# Patient Record
Sex: Male | Born: 1985 | Race: White | Hispanic: No | Marital: Married | Smoking: Former smoker
Health system: Southern US, Community
[De-identification: ages and names within clinical notes are randomized; demographics above are authoritative.]

## PROBLEM LIST (undated history)

## (undated) DIAGNOSIS — Z8619 Personal history of other infectious and parasitic diseases: Secondary | ICD-10-CM

## (undated) HISTORY — DX: Personal history of other infectious and parasitic diseases: Z86.19

---

## 2015-02-14 ENCOUNTER — Ambulatory Visit: Payer: Self-pay | Admitting: Family Medicine

## 2015-04-23 ENCOUNTER — Ambulatory Visit: Payer: Self-pay | Admitting: Family Medicine

## 2015-05-06 ENCOUNTER — Encounter: Payer: Self-pay | Admitting: Family Medicine

## 2015-05-06 ENCOUNTER — Ambulatory Visit (INDEPENDENT_AMBULATORY_CARE_PROVIDER_SITE_OTHER): Payer: 59 | Admitting: Family Medicine

## 2015-05-06 ENCOUNTER — Telehealth: Payer: Self-pay | Admitting: Family Medicine

## 2015-05-06 VITALS — BP 126/78 | HR 89 | Temp 98.0°F | Ht 68.0 in | Wt 201.0 lb

## 2015-05-06 DIAGNOSIS — Z8349 Family history of other endocrine, nutritional and metabolic diseases: Secondary | ICD-10-CM | POA: Insufficient documentation

## 2015-05-06 DIAGNOSIS — Z8249 Family history of ischemic heart disease and other diseases of the circulatory system: Secondary | ICD-10-CM

## 2015-05-06 DIAGNOSIS — E669 Obesity, unspecified: Secondary | ICD-10-CM

## 2015-05-06 DIAGNOSIS — Z0000 Encounter for general adult medical examination without abnormal findings: Secondary | ICD-10-CM

## 2015-05-06 DIAGNOSIS — E66811 Obesity, class 1: Secondary | ICD-10-CM | POA: Insufficient documentation

## 2015-05-06 NOTE — Assessment & Plan Note (Signed)
Will order screening echo for fmhx cardiomyopathy

## 2015-05-06 NOTE — Addendum Note (Signed)
Addended by: Alvina ChouWALSH, Harmoney Sienkiewicz J on: 05/06/2015 03:33 PM   Modules accepted: Orders

## 2015-05-06 NOTE — Assessment & Plan Note (Signed)
Preventative protocols reviewed and updated unless pt declined. Discussed healthy diet and lifestyle.  

## 2015-05-06 NOTE — Progress Notes (Signed)
Pre visit review using our clinic review tool, if applicable. No additional management support is needed unless otherwise documented below in the visit note. 

## 2015-05-06 NOTE — Assessment & Plan Note (Signed)
Check LFT, iron panel next labs.

## 2015-05-06 NOTE — Addendum Note (Signed)
Addended by: Eustaquio BoydenGUTIERREZ, Dionne Rossa on: 05/06/2015 03:24 PM   Modules accepted: Orders

## 2015-05-06 NOTE — Telephone Encounter (Signed)
Keith Rasmussen pt brother called stated Fayrene FearingJames would like you to transfer harry ([birth year]85) family history to his chart Keith Rasmussen see dr g also

## 2015-05-06 NOTE — Telephone Encounter (Signed)
We've not seen this patient. He has cancelled all of his appts. If and when we see him, we will update his history.

## 2015-05-06 NOTE — Progress Notes (Signed)
BP 126/78 mmHg  Pulse 89  Temp(Src) 98 F (36.7 C) (Oral)  Ht  (1.727 m)  Wt 201 lb (91.173 kg)  BMI 30.57 kg/m2  SpO2 97%   CC: new pt to establish  Subjective:    Patient ID: Keith Rasmussen, male    DOB: 11-21-1985, 29 y.o.   MRN: [id number]  HPI: Keith Rasmussen is a 29 y.o. male presenting on 05/06/2015 for Establish Care   Presents with wife, Baxter Hire. Lost 25 lbs in last 6 months.   Preventative: Flu shot - at work Tdap 2015 Seat belt use discussed Sunscreen use discussed. 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 Interim medical history since our last visit reviewed. Allergies and medications reviewed and updated. No current outpatient prescriptions on file prior to visit.   No current facility-administered medications on file prior to visit.    Review of Systems  Constitutional: Negative for fever, chills, activity change, appetite change, fatigue and unexpected weight change.  HENT: Negative for hearing loss.   Eyes: Negative for visual disturbance.  Respiratory: Negative for cough, chest tightness, shortness of breath and wheezing.   Cardiovascular: Negative for chest pain, palpitations and leg swelling.  Gastrointestinal: Negative for nausea, vomiting, abdominal pain, diarrhea, constipation, blood in stool and abdominal distention.  Genitourinary: Negative for hematuria and difficulty urinating.  Musculoskeletal: Negative for myalgias, arthralgias and neck pain.  Skin: Negative for rash.  Neurological: Negative for dizziness, seizures, syncope and headaches.  Hematological: Negative for adenopathy. Does not bruise/bleed easily.  Psychiatric/Behavioral: Negative for dysphoric  mood. The patient is not nervous/anxious.    Per HPI unless specifically indicated in ROS section     Objective:    BP 126/78 mmHg  Pulse 89  Temp(Src) 98 F (36.7 C) (Oral)  Ht  (1.727 m)  Wt 201 lb (91.173 kg)  BMI 30.57 kg/m2  SpO2 97%  Wt Readings from Last 3 Encounters:  05/06/15 201 lb (91.173 kg)    Physical Exam  Constitutional: He is oriented to person, place, and time. He appears well-developed and well-nourished. No distress.  HENT:  Head: Normocephalic and atraumatic.  Right Ear: Hearing, tympanic membrane, external ear and ear canal normal.  Left Ear: Hearing, tympanic membrane, external ear and ear canal normal.  Nose: Nose normal.  Mouth/Throat: Uvula is midline, oropharynx is clear and moist and mucous membranes are normal. No oropharyngeal exudate, posterior oropharyngeal edema or posterior oropharyngeal erythema.  Eyes: Conjunctivae and EOM are normal. Pupils are equal, round, and reactive to light. No scleral icterus.  Neck: Normal range of motion. Neck supple. No thyromegaly present.  Cardiovascular: Normal rate, regular rhythm, normal heart sounds and intact distal pulses.   No murmur heard. Pulses:      Radial pulses are 2+ on the right side, and 2+ on the left side.  Pulmonary/Chest: Effort normal and breath sounds normal. No respiratory distress. He has no wheezes. He has no rales.  Abdominal: Soft. Bowel sounds are normal. He exhibits no distension and no mass. There is no tenderness. There is no rebound and no guarding.  Musculoskeletal: Normal range of motion. He exhibits no edema.  Lymphadenopathy:    He has no cervical adenopathy.  Neurological: He is  alert and oriented to person, place, and time.  CN grossly intact, station and gait intact  Skin: Skin is warm and dry. No rash noted.  Psychiatric: He has a normal mood and affect. His behavior is normal. Judgment and thought content normal.  Nursing note and vitals reviewed.  No results  found for this or any previous visit.    Assessment & Plan:   Problem List Items Addressed This Visit    Obesity, Class I, BMI 30-34.9    Discussed healthy diet and lifestyle changes to affect sustainable weight loss.      Relevant Orders   Lipid panel   Comprehensive metabolic panel   Health maintenance examination - Primary    Preventative protocols reviewed and updated unless pt declined. Discussed healthy diet and lifestyle.       Family history of hemochromatosis    Check LFT, iron panel next labs.      Relevant Orders   Comprehensive metabolic panel   IBC panel   Family history of first-degree relative with cardiomyopathy    Will order screening echo for fmhx cardiomyopathy      Relevant Orders   Echocardiogram       Follow up plan: Return in about 1 year (around 05/05/2016), or as needed, for annual exam, prior fasting for blood work.

## 2015-05-06 NOTE — Assessment & Plan Note (Signed)
Discussed healthy diet and lifestyle changes to affect sustainable weight loss  

## 2015-05-06 NOTE — Patient Instructions (Addendum)
[facility]. We will check liver, iron and ultrasound of heart. Return as needed or in 1 -2 years for next physical.  Health Maintenance, Male A healthy lifestyle and preventative care can promote health and wellness.  Maintain regular health, dental, and eye exams.  Eat a healthy diet. Foods like vegetables, fruits, whole grains, low-fat dairy products, and lean protein foods contain the nutrients you need and are low in calories. Decrease your intake of foods high in solid fats, added sugars, and salt. Get information about a proper diet from your health care provider, if necessary.  Regular physical exercise is one of the most important things you can do for your health. Most adults should get at least 150 minutes of moderate-intensity exercise (any activity that increases your heart rate and causes you to sweat) each week. In addition, most adults need muscle-strengthening exercises on 2 or more days a week.   Maintain a healthy weight. The body mass index (BMI) is a screening tool to identify possible weight problems. It provides an estimate of body fat based on height and weight. Your health care provider can find your BMI and can help you achieve or maintain a healthy weight. For males 20 years and older:  A BMI below 18.5 is considered underweight.  A BMI of 18.5 to 24.9 is normal.  A BMI of 25 to 29.9 is considered overweight.  A BMI of 30 and above is considered obese.  Maintain normal blood lipids and cholesterol by exercising and minimizing your intake of saturated fat. Eat a balanced diet with plenty of fruits and vegetables. Blood tests for lipids and cholesterol should begin at age 28 and be repeated every 5 years. If your lipid or cholesterol levels are high, you are over age 28, or you are at high risk for heart disease, you may need your cholesterol levels checked more frequently.Ongoing high lipid and cholesterol  levels should be treated with medicines if diet and exercise are not working.  If you smoke, find out from your health care provider how to quit. If you do not use tobacco, do not start.  Lung cancer screening is recommended for adults aged 55-80 years who are at high risk for developing lung cancer because of a history of smoking. A yearly low-dose CT scan of the lungs is recommended for people who have at least a 30-pack-year history of smoking and are current smokers or have quit within the past 15 years. A pack year of smoking is smoking an average of 1 pack of cigarettes a day for 1 year (for example, a 30-pack-year history of smoking could mean smoking 1 pack a day for 30 years or 2 packs a day for 15 years). Yearly screening should continue until the smoker has stopped smoking for at least 15 years. Yearly screening should be stopped for people who develop a health problem that would prevent them from having lung cancer treatment.  If you choose to drink alcohol, do not have more than 2 drinks per day. One drink is considered to be 12 oz (360 mL) of beer, 5 oz (150 mL) of wine, or 1.5 oz (45 mL) of liquor.  Avoid the use of street drugs. Do not share needles with anyone. Ask for help if you need support or instructions about stopping the use of drugs.  High blood pressure causes heart disease and increases the risk of stroke. High blood pressure is more likely  to develop in:  People who have blood pressure in the end of the normal range (100-139/85-89 mm Hg).  People who are overweight or obese.  People who are African American.  If you are 64-19 years of age, have your blood pressure checked every 3-5 years. If you are 12 years of age or older, have your blood pressure checked every year. You should have your blood pressure measured twice--once when you are at a hospital or clinic, and once when you are not at a hospital or clinic. Record the average of the two measurements. To check your  blood pressure when you are not at a hospital or clinic, you can use:  An automated blood pressure machine at a pharmacy.  A home blood pressure monitor.  If you are 39-60 years old, ask your health care provider if you should take aspirin to prevent heart disease.  Diabetes screening involves taking a blood sample to check your fasting blood sugar level. This should be done once every 3 years after age 57 if you are at a normal weight and without risk factors for diabetes. Testing should be considered at a younger age or be carried out more frequently if you are overweight and have at least 1 risk factor for diabetes.  Colorectal cancer can be detected and often prevented. Most routine colorectal cancer screening begins at the age of 97 and continues through age 69. However, your health care provider may recommend screening at an earlier age if you have risk factors for colon cancer. On a yearly basis, your health care provider may provide home test kits to check for hidden blood in the stool. A small camera at the end of a tube may be used to directly examine the colon (sigmoidoscopy or colonoscopy) to detect the earliest forms of colorectal cancer. Talk to your health care provider about this at age 45 when routine screening begins. A direct exam of the colon should be repeated every 5-10 years through age 77, unless early forms of precancerous polyps or small growths are found.  People who are at an increased risk for hepatitis B should be screened for this virus. You are considered at high risk for hepatitis B if:  You were born in a country where hepatitis B occurs often. Talk with your health care provider about which countries are considered high risk.  Your parents were born in a high-risk country and you have not received a shot to protect against hepatitis B (hepatitis B vaccine).  You have HIV or AIDS.  You use needles to inject street drugs.  You live with, or have sex with,  someone who has hepatitis B.  You are a man who has sex with other men (MSM).  You get hemodialysis treatment.  You take certain medicines for conditions like cancer, organ transplantation, and autoimmune conditions.  Hepatitis C blood testing is recommended for all people born from [birth year] through 1964 and any individual with known risk factors for hepatitis C.  Healthy men should no longer receive prostate-specific antigen (PSA) blood tests as part of routine cancer screening. Talk to your health care provider about prostate cancer screening.  Testicular cancer screening is not recommended for adolescents or adult males who have no symptoms. Screening includes self-exam, a health care provider exam, and other screening tests. Consult with your health care provider about any symptoms you have or any concerns you have about testicular cancer.  Practice safe sex. Use condoms and avoid high-risk sexual practices to  reduce the spread of sexually transmitted infections (STIs).  You should be screened for STIs, including gonorrhea and chlamydia if:  You are sexually active and are younger than 24 years.  You are older than 24 years, and your health care provider tells you that you are at risk for this type of infection.  Your sexual activity has changed since you were last screened, and you are at an increased risk for chlamydia or gonorrhea. Ask your health care provider if you are at risk.  If you are at risk of being infected with HIV, it is recommended that you take a prescription medicine daily to prevent HIV infection. This is called pre-exposure prophylaxis (PrEP). You are considered at risk if:  You are a man who has sex with other men (MSM).  You are a heterosexual man who is sexually active with multiple partners.  You take drugs by injection.  You are sexually active with a partner who has HIV.  Talk with your health care provider about whether you are at high risk of being  infected with HIV. If you choose to begin PrEP, you should first be tested for HIV. You should then be tested every 3 months for as long as you are taking PrEP.  Use sunscreen. Apply sunscreen liberally and repeatedly throughout the day. You should seek shade when your shadow is shorter than you. Protect yourself by wearing long sleeves, pants, a wide-brimmed hat, and sunglasses year round whenever you are outdoors.  Tell your health care provider of new moles or changes in moles, especially if there is a change in shape or color. Also, tell your health care provider if a mole is larger than the size of a pencil eraser.  A one-time screening for abdominal aortic aneurysm (AAA) and surgical repair of large AAAs by ultrasound is recommended for men aged 65-75 years who are current or former smokers.  Stay current with your vaccines (immunizations).   This information is not intended to replace advice given to you by your health care provider. Make sure you discuss any questions you have with your health care provider.   Document Released: 12/05/2007 Document Revised: 06/29/2014 Document Reviewed: 11/03/2010 Elsevier Interactive Patient Education [employer]

## 2015-05-20 ENCOUNTER — Ambulatory Visit: Payer: Self-pay | Admitting: Family Medicine

## 2015-05-21 ENCOUNTER — Other Ambulatory Visit: Payer: Self-pay | Admitting: Family Medicine

## 2015-05-21 DIAGNOSIS — Z8249 Family history of ischemic heart disease and other diseases of the circulatory system: Secondary | ICD-10-CM

## 2015-05-23 LAB — COMPREHENSIVE METABOLIC PANEL WITH GFR
ALT: 9 IU/L (ref 0–44)
AST: 19 IU/L (ref 0–40)
Albumin/Globulin Ratio: 1.8 (ref 1.1–2.5)
Albumin: 4.6 g/dL (ref 3.5–5.5)
Alkaline Phosphatase: 85 IU/L (ref 39–117)
BUN/Creatinine Ratio: 8 (ref 8–19)
BUN: 8 mg/dL (ref 6–20)
Bilirubin Total: 0.6 mg/dL (ref 0.0–1.2)
CO2: 24 mmol/L (ref 18–29)
Calcium: 9.6 mg/dL (ref 8.7–10.2)
Chloride: 101 mmol/L (ref 97–106)
Creatinine, Ser: 0.99 mg/dL (ref 0.76–1.27)
GFR calc Af Amer: 119 mL/min/1.73
GFR calc non Af Amer: 103 mL/min/1.73
Globulin, Total: 2.5 g/dL (ref 1.5–4.5)
Glucose: 96 mg/dL (ref 65–99)
Potassium: 4.6 mmol/L (ref 3.5–5.2)
Sodium: 142 mmol/L (ref 136–144)
Total Protein: 7.1 g/dL (ref 6.0–8.5)

## 2015-05-23 LAB — TRANSFERRIN: Transferrin: 250 mg/dL (ref 200–370)

## 2015-05-23 LAB — IRON AND TIBC
Iron Saturation: 41 % (ref 15–55)
Iron: 119 ug/dL (ref 38–169)
Total Iron Binding Capacity: 291 ug/dL (ref 250–450)
UIBC: 172 ug/dL (ref 111–343)

## 2015-05-23 LAB — LIPID PANEL
Chol/HDL Ratio: 3.4 ratio (ref 0.0–5.0)
Cholesterol, Total: 144 mg/dL (ref 100–199)
HDL: 42 mg/dL
LDL Calculated: 86 mg/dL (ref 0–99)
Triglycerides: 81 mg/dL (ref 0–149)
VLDL Cholesterol Cal: 16 mg/dL (ref 5–40)

## 2015-05-24 ENCOUNTER — Telehealth: Payer: Self-pay | Admitting: Family Medicine

## 2015-05-24 ENCOUNTER — Encounter: Payer: Self-pay | Admitting: *Deleted

## 2015-05-24 NOTE — Telephone Encounter (Signed)
See below. Would have patient check with his insurance prior to getting echo done - about coverage for echo in family history of hemochromatosis and severe cardiomyopathy at an early age.

## 2015-05-24 NOTE — Telephone Encounter (Signed)
-----   Message from Rosiland OzMelissa C Church sent at 05/21/2015 11:48 AM EST ----- Regarding: Echo diagnosis Dr. 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 This will likely not be covered by patient's insurance, since it is a "Z" code.  Is there another code we can use? Otherwise, patient may have to pay out of pocket (@$750), and we will need him to sign an ABN.  Didn't want to blind-side the patient with this.  Please advise, Thanks, Missy

## 2015-05-24 NOTE — Telephone Encounter (Signed)
Spoke with patient and he was aware of probable out of pocket cost. He is still discussing things with his wife and said if they decide to not have echo done, he will call and cancel appt.

## 2015-05-28 ENCOUNTER — Other Ambulatory Visit: Payer: Self-pay

## 2015-05-28 ENCOUNTER — Ambulatory Visit (INDEPENDENT_AMBULATORY_CARE_PROVIDER_SITE_OTHER): Payer: 59

## 2015-05-28 DIAGNOSIS — Z8249 Family history of ischemic heart disease and other diseases of the circulatory system: Secondary | ICD-10-CM | POA: Diagnosis not present

## 2015-05-29 ENCOUNTER — Encounter: Payer: Self-pay | Admitting: *Deleted

## 2015-05-31 ENCOUNTER — Telehealth: Payer: Self-pay | Admitting: Family Medicine

## 2015-05-31 NOTE — Telephone Encounter (Signed)
Pt called to see if echo came back. Pt request cb at [phone] Thank you

## 2015-05-31 NOTE — Telephone Encounter (Signed)
Spoke with patient. Normal results. Letter had been mailed earlier and he hasn't received it yet.

## 2019-05-31 ENCOUNTER — Other Ambulatory Visit: Payer: Self-pay

## 2019-05-31 DIAGNOSIS — Z20822 Contact with and (suspected) exposure to covid-19: Secondary | ICD-10-CM

## 2019-06-02 LAB — NOVEL CORONAVIRUS, NAA: SARS-CoV-2, NAA: NOT DETECTED

## 2019-10-05 ENCOUNTER — Observation Stay
Admission: EM | Admit: 2019-10-05 | Discharge: 2019-10-07 | Disposition: A | Discharge status: Home / Self Care | Payer: Managed Care, Other (non HMO) | Attending: Surgery | Admitting: Surgery

## 2019-10-05 ENCOUNTER — Encounter: Payer: Self-pay | Admitting: *Deleted

## 2019-10-05 ENCOUNTER — Emergency Department: Payer: Managed Care, Other (non HMO)

## 2019-10-05 ENCOUNTER — Other Ambulatory Visit: Payer: Self-pay

## 2019-10-05 DIAGNOSIS — Z87891 Personal history of nicotine dependence: Secondary | ICD-10-CM | POA: Diagnosis not present

## 2019-10-05 DIAGNOSIS — Z20822 Contact with and (suspected) exposure to covid-19: Secondary | ICD-10-CM | POA: Diagnosis not present

## 2019-10-05 DIAGNOSIS — K35891 Other acute appendicitis without perforation, with gangrene: Principal | ICD-10-CM | POA: Insufficient documentation

## 2019-10-05 DIAGNOSIS — R1030 Lower abdominal pain, unspecified: Secondary | ICD-10-CM

## 2019-10-05 DIAGNOSIS — K3580 Unspecified acute appendicitis: Secondary | ICD-10-CM

## 2019-10-05 DIAGNOSIS — K37 Unspecified appendicitis: Secondary | ICD-10-CM | POA: Diagnosis present

## 2019-10-05 LAB — CBC
HCT: 46.5 % (ref 39.0–52.0)
Hemoglobin: 16.3 g/dL (ref 13.0–17.0)
MCH: 31.7 pg (ref 26.0–34.0)
MCHC: 35.1 g/dL (ref 30.0–36.0)
MCV: 90.3 fL (ref 80.0–100.0)
Platelets: 280 10*3/uL (ref 150–400)
RBC: 5.15 MIL/uL (ref 4.22–5.81)
RDW: 11.6 % (ref 11.5–15.5)
WBC: 20.9 10*3/uL — ABNORMAL HIGH (ref 4.0–10.5)
nRBC: 0 % (ref 0.0–0.2)

## 2019-10-05 LAB — COMPREHENSIVE METABOLIC PANEL
ALT: 19 U/L (ref 0–44)
AST: 23 U/L (ref 15–41)
Albumin: 4.7 g/dL (ref 3.5–5.0)
Alkaline Phosphatase: 73 U/L (ref 38–126)
Anion gap: 12 (ref 5–15)
BUN: 13 mg/dL (ref 6–20)
CO2: 25 mmol/L (ref 22–32)
Calcium: 9.6 mg/dL (ref 8.9–10.3)
Chloride: 99 mmol/L (ref 98–111)
Creatinine, Ser: 0.93 mg/dL (ref 0.61–1.24)
GFR calc Af Amer: >60 mL/min (ref >60)
GFR calc non Af Amer: >60 mL/min (ref >60)
Glucose, Bld: 159 mg/dL — ABNORMAL HIGH (ref 70–99)
Potassium: 4.3 mmol/L (ref 3.5–5.1)
Sodium: 136 mmol/L (ref 135–145)
Total Bilirubin: 1.1 mg/dL (ref 0.3–1.2)
Total Protein: 7.6 g/dL (ref 6.5–8.1)

## 2019-10-05 LAB — LIPASE, BLOOD: Lipase: 22 U/L (ref 11–51)

## 2019-10-05 MED ORDER — ONDANSETRON HCL 4 MG/2ML IJ SOLN: 4.0000 mg | Freq: Four times a day (QID) | INTRAMUSCULAR | Status: DC | PRN

## 2019-10-05 MED ORDER — PANTOPRAZOLE SODIUM 40 MG IV SOLR
40.0000 mg | Freq: Every day | INTRAVENOUS | Status: DC
  Administered 2019-10-06 (×2): 40 mg via INTRAVENOUS
  Filled 2019-10-05 (×2): qty 40

## 2019-10-05 MED ORDER — DIPHENHYDRAMINE HCL 12.5 MG/5ML PO ELIX
12.5000 mg | ORAL_SOLUTION | Freq: Four times a day (QID) | ORAL | Status: DC | PRN
  Filled 2019-10-05: qty 5

## 2019-10-05 MED ORDER — IOHEXOL 300 MG/ML  SOLN
100.0000 mL | Freq: Once | INTRAMUSCULAR | Status: AC | PRN
  Administered 2019-10-05: 100 mL via INTRAVENOUS

## 2019-10-05 MED ORDER — HEPARIN SODIUM (PORCINE) 5000 UNIT/ML IJ SOLN
5000.0000 [IU] | Freq: Three times a day (TID) | INTRAMUSCULAR | Status: DC
  Administered 2019-10-06 (×2): 5000 [IU] via SUBCUTANEOUS
  Filled 2019-10-05 (×2): qty 1

## 2019-10-05 MED ORDER — PROCHLORPERAZINE EDISYLATE 10 MG/2ML IJ SOLN: 5.0000 mg | Freq: Four times a day (QID) | INTRAMUSCULAR | Status: DC | PRN

## 2019-10-05 MED ORDER — HYDRALAZINE HCL 20 MG/ML IJ SOLN: 10.0000 mg | INTRAMUSCULAR | Status: DC | PRN

## 2019-10-05 MED ORDER — KETOROLAC TROMETHAMINE 30 MG/ML IJ SOLN
30.0000 mg | Freq: Four times a day (QID) | INTRAMUSCULAR | Status: DC | PRN
  Administered 2019-10-06: 30 mg via INTRAVENOUS
  Filled 2019-10-05 (×3): qty 1

## 2019-10-05 MED ORDER — ONDANSETRON HCL 4 MG/2ML IJ SOLN
4.0000 mg | Freq: Once | INTRAMUSCULAR | Status: AC
  Administered 2019-10-05: 4 mg via INTRAVENOUS
  Filled 2019-10-05: qty 2

## 2019-10-05 MED ORDER — PROCHLORPERAZINE MALEATE 10 MG PO TABS
10.0000 mg | ORAL_TABLET | Freq: Four times a day (QID) | ORAL | Status: DC | PRN
  Filled 2019-10-05: qty 1

## 2019-10-05 MED ORDER — SODIUM CHLORIDE 0.9 % IV BOLUS
1000.0000 mL | Freq: Once | INTRAVENOUS | Status: AC
  Administered 2019-10-05: 1000 mL via INTRAVENOUS

## 2019-10-05 MED ORDER — DIPHENHYDRAMINE HCL 50 MG/ML IJ SOLN: 12.5000 mg | Freq: Four times a day (QID) | INTRAMUSCULAR | Status: DC | PRN

## 2019-10-05 MED ORDER — ONDANSETRON 4 MG PO TBDP: 4.0000 mg | ORAL_TABLET | Freq: Four times a day (QID) | ORAL | Status: DC | PRN

## 2019-10-05 MED ORDER — PIPERACILLIN-TAZOBACTAM 3.375 G IVPB 30 MIN
3.3750 g | Freq: Once | INTRAVENOUS | Status: AC
  Administered 2019-10-05: 3.375 g via INTRAVENOUS
  Filled 2019-10-05: qty 50

## 2019-10-05 MED ORDER — MORPHINE SULFATE (PF) 4 MG/ML IV SOLN
4.0000 mg | INTRAVENOUS | Status: DC | PRN
  Administered 2019-10-06 (×2): 4 mg via INTRAVENOUS
  Filled 2019-10-05 (×2): qty 1

## 2019-10-05 MED ORDER — MORPHINE SULFATE (PF) 4 MG/ML IV SOLN
4.0000 mg | Freq: Once | INTRAVENOUS | Status: AC
  Administered 2019-10-05: 4 mg via INTRAVENOUS
  Filled 2019-10-05: qty 1

## 2019-10-05 MED ORDER — SODIUM CHLORIDE 0.9 % IV SOLN: INTRAVENOUS | Status: DC

## 2019-10-05 MED ORDER — PIPERACILLIN-TAZOBACTAM 3.375 G IVPB
3.3750 g | Freq: Three times a day (TID) | INTRAVENOUS | Status: DC
  Administered 2019-10-06 – 2019-10-07 (×4): 3.375 g via INTRAVENOUS
  Filled 2019-10-05 (×3): qty 50

## 2019-10-05 MED ORDER — SODIUM CHLORIDE 0.9% FLUSH: 3.0000 mL | Freq: Once | INTRAVENOUS | Status: DC

## 2019-10-05 NOTE — ED Notes (Signed)
Unable to void at this time.

## 2019-10-05 NOTE — ED Provider Notes (Signed)
[facility] Emergency Department Provider Note  ____________________________________________   First MD Initiated Contact with Patient 10/05/19 2259     (approximate)  I have reviewed the triage vital signs and the nursing notes.  History  Chief Complaint Abdominal Pain    HPI Keith Rasmussen is a 34 y.o. male no significant past medical history, who presents emergency department for lower abdominal pain.  Patient states symptoms first started around 1 AM today (10/05/19).  Located to the lower abdomen, states it feels like constipation pain.  Initially mild in severity, progressively worsening throughout the day and seemed to migrate to the right lower quadrant.  Radiates somewhat to the back area.  Tried taking a bottle of magnesium citrate, which did help with a bowel movement, but did not alleviate his symptoms.  Attempted to eat a few bites of toast around 6 PM which significantly worsened his pain.  Nausea and 1 episode of vomiting.  No known fevers.  No dysuria or hematuria.  No history of intra-abdominal surgery.  Received his second COVID-19 vaccine today.   Past Medical Hx Past Medical History:  Diagnosis Date  . History of chicken pox     Problem List Patient Active Problem List   Diagnosis Date Noted  . Health maintenance examination 05/06/2015  . Obesity, Class I, BMI 30-34.9 05/06/2015  . Family history of hemochromatosis 05/06/2015  . Family history of first-degree relative with cardiomyopathy 05/06/2015    Past Surgical Hx No past surgical history on file.  Medications Prior to Admission medications   Not on File    Allergies Patient has no known allergies.  Family Hx Family History  Problem Relation Age of Onset  . Hyperlipidemia Mother   . Hypertension Mother   . Hemochromatosis Mother   . Arthritis Father   . Hyperlipidemia Father   . CAD Father 56       smoker  . Stroke Father   . Hypertension Father   . Peripheral  vascular disease Father   . Cancer Father        mouth, smoker  . Heart disease Brother   . Cardiomyopathy Brother 37       LVAD in place (congenital?) half brother  . Cancer Maternal Grandmother 55       uterine  . COPD Paternal Grandmother   . Diabetes Paternal Grandmother   . Bipolar disorder Maternal Uncle     Social Hx Social History   Tobacco Use  . Smoking status: Former Games developer  . Smokeless tobacco: Former Neurosurgeon    Types: Snuff    Quit date: 06/22/2009  . Tobacco comment: ex cigarettes  Substance Use Topics  . Alcohol use: Yes    Alcohol/week: 0.0 standard drinks    Comment: infrequent  . Drug use: No     Review of Systems  Constitutional: Negative for fever. Negative for chills. Eyes: Negative for visual changes. ENT: Negative for sore throat. Cardiovascular: Negative for chest pain. Respiratory: Negative for shortness of breath. Gastrointestinal: Positive for nausea, vomiting, abdominal pain. Genitourinary: Negative for dysuria. Musculoskeletal: Negative for leg swelling. Skin: Negative for rash. Neurological: Negative for headaches.   Physical Exam  Vital Signs: ED Triage Vitals  Enc Vitals Group     BP 10/05/19 2113 124/70     Pulse Rate 10/05/19 2109 (!) 55     Resp 10/05/19 2109 18     Temp 10/05/19 2109 98 F (36.7 C)     Temp Source 10/05/19 2109  Oral     SpO2 10/05/19 2109 100 %     Weight 10/05/19 2111 215 lb (97.5 kg)     Height 10/05/19 2111 5\' 8"  (1.727 m)     Head Circumference --      Peak Flow --      Pain Score 10/05/19 2111 7     Pain Loc --      Pain Edu? --      Excl. in [place]? --     Constitutional: Alert and oriented. Well appearing. NAD.  Head: Normocephalic. Atraumatic. Eyes: Conjunctivae clear. Sclera anicteric. Pupils equal and symmetric. Nose: No masses or lesions. No congestion or rhinorrhea. Mouth/Throat: Wearing mask.  Neck: No stridor. Trachea midline.  Cardiovascular: Normal rate, regular rhythm. Extremities well  perfused. Respiratory: Normal respiratory effort.  Lungs CTAB. Gastrointestinal: Soft. Non-distended.  TTP right lower quadrant.  Remainder of abdomen soft and nontender.  No rebound, guarding, or rigidity. Genitourinary: Deferred. Musculoskeletal: No lower extremity edema. No deformities. Neurologic:  Normal speech and language. No gross focal or lateralizing neurologic deficits are appreciated.  Skin: Skin is warm, dry and intact. No rash noted. Psychiatric: Mood and affect are appropriate for situation.  EKG  N/A    Radiology  Personally reviewed available imaging myself.   CT A/P IMPRESSION:  1. Acute uncomplicated appendicitis. No perforation, fluid  collection, or abscess.    Procedures  Procedure(s) performed (including critical care):  Procedures   Initial Impression / Assessment and Plan / MDM / ED Course  34 y.o. male who presents to the ED for lower abdominal pain, nausea, vomiting.  Ddx: appendicitis, nephrolithiasis, gastroenteritis  Will plan for labs, imaging, fluids/symptom control.  Clinical Course as of Oct 05 2351  Thu Oct 05, 2019  2331 Labs reveal leukocytosis to 20.9. CT scan reveals acute uncomplicated appendicitis. Will order antibiotics and discuss w/ surgery.   [SM]  2353 Discussed results w/ patient and partner at bedside. Patient admitted to surgery.   [SM]    Clinical Course User Index [SM] Lilia Pro., MD     _______________________________   As part of my medical decision making I have reviewed available labs, radiology tests, reviewed old records/chart review, obtained additional history from family, and discussed with consultants (Dr. Dahlia Byes, surgery).     Final Clinical Impression(s) / ED Diagnosis  Final diagnoses:  Lower abdominal pain  Acute appendicitis, unspecified acute appendicitis type       Note:  This document was prepared using Dragon voice recognition software and may include unintentional dictation  errors.   Lilia Pro., MD 10/05/19 [phone]

## 2019-10-05 NOTE — ED Triage Notes (Signed)
Pt has abd pain.  Pt drank mag citrate and had good results for constipation.  Vomited x 1.  Pt has right lower back pain.  Sx since this am.  Pt alert.

## 2019-10-05 NOTE — ED Notes (Signed)
Pt reports bilateral lower abdominal pain, with some tenderness to lower right abdomen, with radiation to right lower back

## 2019-10-05 NOTE — Progress Notes (Signed)
Pt d/w Dr. Colon Branch Abd pain, not septic nor peritonitic per ER provider. CT reviewed showing appendicitis, no perf or abscess. We will admit, place NPO, start A/Bs and schedule him for lap appy tomorrow w Dr. Aleen Campi or Dr. Lady Gary depending on  their  Availability and the OR 's Availability.

## 2019-10-06 ENCOUNTER — Encounter
Admission: EM | Disposition: A | Discharge status: Home / Self Care | Payer: Self-pay | Source: Home / Self Care | Attending: Student

## 2019-10-06 ENCOUNTER — Observation Stay: Payer: Managed Care, Other (non HMO) | Admitting: Certified Registered"

## 2019-10-06 ENCOUNTER — Encounter: Payer: Self-pay | Admitting: Surgery

## 2019-10-06 ENCOUNTER — Other Ambulatory Visit: Payer: Self-pay

## 2019-10-06 DIAGNOSIS — K3580 Unspecified acute appendicitis: Secondary | ICD-10-CM

## 2019-10-06 HISTORY — PX: LAPAROSCOPIC APPENDECTOMY: SHX408

## 2019-10-06 LAB — URINALYSIS, COMPLETE (UACMP) WITH MICROSCOPIC
Bacteria, UA: NONE SEEN
Bilirubin Urine: NEGATIVE
Glucose, UA: NEGATIVE mg/dL
Hgb urine dipstick: NEGATIVE
Ketones, ur: 20 mg/dL — AB
Leukocytes,Ua: NEGATIVE
Nitrite: NEGATIVE
Protein, ur: NEGATIVE mg/dL
Specific Gravity, Urine: >1.046 — ABNORMAL HIGH (ref 1.005–1.030)
Squamous Epithelial / HPF: NONE SEEN (ref 0–5)
pH: 6 (ref 5.0–8.0)

## 2019-10-06 LAB — HIV ANTIBODY (ROUTINE TESTING W REFLEX): HIV Screen 4th Generation wRfx: NONREACTIVE

## 2019-10-06 LAB — CBC
HCT: 44.8 % (ref 39.0–52.0)
Hemoglobin: 15.7 g/dL (ref 13.0–17.0)
MCH: 31.8 pg (ref 26.0–34.0)
MCHC: 35 g/dL (ref 30.0–36.0)
MCV: 90.7 fL (ref 80.0–100.0)
Platelets: 271 10*3/uL (ref 150–400)
RBC: 4.94 MIL/uL (ref 4.22–5.81)
RDW: 11.7 % (ref 11.5–15.5)
WBC: 23.5 10*3/uL — ABNORMAL HIGH (ref 4.0–10.5)
nRBC: 0 % (ref 0.0–0.2)

## 2019-10-06 LAB — RESPIRATORY PANEL BY RT PCR (FLU A&B, COVID)
Influenza A by PCR: NEGATIVE
Influenza B by PCR: NEGATIVE
SARS Coronavirus 2 by RT PCR: NEGATIVE

## 2019-10-06 LAB — SURGICAL PCR SCREEN
MRSA, PCR: NEGATIVE
Staphylococcus aureus: POSITIVE — AB

## 2019-10-06 SURGERY — APPENDECTOMY, LAPAROSCOPIC
Anesthesia: General | Site: Abdomen

## 2019-10-06 MED ORDER — HYDROMORPHONE HCL 1 MG/ML IJ SOLN: 0.5000 mg | INTRAMUSCULAR | Status: DC | PRN

## 2019-10-06 MED ORDER — ONDANSETRON HCL 4 MG/2ML IJ SOLN
INTRAMUSCULAR | Status: DC | PRN
  Administered 2019-10-06: 4 mg via INTRAVENOUS

## 2019-10-06 MED ORDER — SEVOFLURANE IN SOLN
RESPIRATORY_TRACT | Status: AC
  Filled 2019-10-06: qty 250

## 2019-10-06 MED ORDER — LACTATED RINGERS IV SOLN: INTRAVENOUS | Status: DC | PRN

## 2019-10-06 MED ORDER — DEXAMETHASONE SODIUM PHOSPHATE 10 MG/ML IJ SOLN
INTRAMUSCULAR | Status: AC
  Filled 2019-10-06: qty 1

## 2019-10-06 MED ORDER — BUPIVACAINE-EPINEPHRINE (PF) 0.5% -1:200000 IJ SOLN
INTRAMUSCULAR | Status: AC
  Filled 2019-10-06: qty 90

## 2019-10-06 MED ORDER — LIDOCAINE HCL (PF) 2 % IJ SOLN
INTRAMUSCULAR | Status: AC
  Filled 2019-10-06: qty 5

## 2019-10-06 MED ORDER — MIDAZOLAM HCL 2 MG/2ML IJ SOLN
INTRAMUSCULAR | Status: DC | PRN
  Administered 2019-10-06: 2 mg via INTRAVENOUS

## 2019-10-06 MED ORDER — MIDAZOLAM HCL 2 MG/2ML IJ SOLN
INTRAMUSCULAR | Status: AC
  Filled 2019-10-06: qty 2

## 2019-10-06 MED ORDER — PROPOFOL 10 MG/ML IV BOLUS
INTRAVENOUS | Status: AC
  Filled 2019-10-06: qty 20

## 2019-10-06 MED ORDER — FENTANYL CITRATE (PF) 100 MCG/2ML IJ SOLN
INTRAMUSCULAR | Status: DC | PRN
  Administered 2019-10-06: 50 ug via INTRAVENOUS
  Administered 2019-10-06 (×2): 25 ug via INTRAVENOUS
  Administered 2019-10-06 (×2): 50 ug via INTRAVENOUS

## 2019-10-06 MED ORDER — SUGAMMADEX SODIUM 200 MG/2ML IV SOLN
INTRAVENOUS | Status: DC | PRN
  Administered 2019-10-06: 200 mg via INTRAVENOUS

## 2019-10-06 MED ORDER — FENTANYL CITRATE (PF) 100 MCG/2ML IJ SOLN
INTRAMUSCULAR | Status: AC
  Filled 2019-10-06: qty 2

## 2019-10-06 MED ORDER — OXYCODONE HCL 5 MG PO TABS
5.0000 mg | ORAL_TABLET | ORAL | Status: DC | PRN
  Administered 2019-10-07: 5 mg via ORAL
  Filled 2019-10-06: qty 1

## 2019-10-06 MED ORDER — SODIUM CHLORIDE 0.9 % IV SOLN: INTRAVENOUS | Status: DC | PRN

## 2019-10-06 MED ORDER — ROCURONIUM BROMIDE 100 MG/10ML IV SOLN
INTRAVENOUS | Status: DC | PRN
  Administered 2019-10-06: 10 mg via INTRAVENOUS
  Administered 2019-10-06: 50 mg via INTRAVENOUS
  Administered 2019-10-06 (×2): 10 mg via INTRAVENOUS

## 2019-10-06 MED ORDER — MUPIROCIN 2 % EX OINT
1.0000 "application " | TOPICAL_OINTMENT | Freq: Two times a day (BID) | CUTANEOUS | Status: DC
  Administered 2019-10-06 (×2): 1 via NASAL
  Filled 2019-10-06: qty 22

## 2019-10-06 MED ORDER — ROCURONIUM BROMIDE 10 MG/ML (PF) SYRINGE
PREFILLED_SYRINGE | INTRAVENOUS | Status: AC
  Filled 2019-10-06: qty 10

## 2019-10-06 MED ORDER — PROPOFOL 10 MG/ML IV BOLUS
INTRAVENOUS | Status: DC | PRN
  Administered 2019-10-06: 200 mg via INTRAVENOUS

## 2019-10-06 MED ORDER — FENTANYL CITRATE (PF) 100 MCG/2ML IJ SOLN: 25.0000 ug | INTRAMUSCULAR | Status: DC | PRN

## 2019-10-06 MED ORDER — DEXMEDETOMIDINE HCL IN NACL 80 MCG/20ML IV SOLN
INTRAVENOUS | Status: AC
  Filled 2019-10-06: qty 20

## 2019-10-06 MED ORDER — ACETAMINOPHEN 10 MG/ML IV SOLN
INTRAVENOUS | Status: AC
  Filled 2019-10-06: qty 100

## 2019-10-06 MED ORDER — ACETAMINOPHEN 10 MG/ML IV SOLN
INTRAVENOUS | Status: DC | PRN
  Administered 2019-10-06: 1000 mg via INTRAVENOUS

## 2019-10-06 MED ORDER — SUCCINYLCHOLINE CHLORIDE 200 MG/10ML IV SOSY
PREFILLED_SYRINGE | INTRAVENOUS | Status: AC
  Filled 2019-10-06: qty 10

## 2019-10-06 MED ORDER — KETOROLAC TROMETHAMINE 30 MG/ML IJ SOLN
INTRAMUSCULAR | Status: DC | PRN
  Administered 2019-10-06: 30 mg via INTRAVENOUS

## 2019-10-06 MED ORDER — LIDOCAINE HCL (CARDIAC) PF 100 MG/5ML IV SOSY
PREFILLED_SYRINGE | INTRAVENOUS | Status: DC | PRN
  Administered 2019-10-06: 100 mg via INTRAVENOUS

## 2019-10-06 MED ORDER — DEXAMETHASONE SODIUM PHOSPHATE 10 MG/ML IJ SOLN
INTRAMUSCULAR | Status: DC | PRN
  Administered 2019-10-06: 10 mg via INTRAVENOUS

## 2019-10-06 MED ORDER — ONDANSETRON HCL 4 MG/2ML IJ SOLN: 4.0000 mg | Freq: Once | INTRAMUSCULAR | Status: DC | PRN

## 2019-10-06 MED ORDER — PIPERACILLIN-TAZOBACTAM 3.375 G IVPB
INTRAVENOUS | Status: AC
  Filled 2019-10-06: qty 50

## 2019-10-06 MED ORDER — ONDANSETRON HCL 4 MG/2ML IJ SOLN
INTRAMUSCULAR | Status: AC
  Filled 2019-10-06: qty 2

## 2019-10-06 MED ORDER — BUPIVACAINE-EPINEPHRINE (PF) 0.5% -1:200000 IJ SOLN
INTRAMUSCULAR | Status: DC | PRN
  Administered 2019-10-06: 30 mL via PERINEURAL

## 2019-10-06 MED ORDER — ACETAMINOPHEN 500 MG PO TABS: 1000.0000 mg | ORAL_TABLET | Freq: Four times a day (QID) | ORAL | Status: DC | PRN

## 2019-10-06 SURGICAL SUPPLY — 43 items
BLADE CLIPPER SURG (BLADE) ×3 IMPLANT
CANISTER SUCT 1200ML W/VALVE (MISCELLANEOUS) ×3 IMPLANT
CHLORAPREP W/TINT 26 (MISCELLANEOUS) ×3 IMPLANT
COVER WAND RF STERILE (DRAPES) ×3 IMPLANT
CUTTER FLEX LINEAR 45M (STAPLE) ×3 IMPLANT
DERMABOND ADVANCED (GAUZE/BANDAGES/DRESSINGS) ×2
DERMABOND ADVANCED .7 DNX12 (GAUZE/BANDAGES/DRESSINGS) ×1 IMPLANT
ELECT CAUTERY BLADE 6.4 (BLADE) ×6 IMPLANT
ELECT REM PT RETURN 9FT ADLT (ELECTROSURGICAL) ×3
ELECTRODE REM PT RTRN 9FT ADLT (ELECTROSURGICAL) ×1 IMPLANT
GLOVE SURG SYN 7.0 (GLOVE) ×3 IMPLANT
GLOVE SURG SYN 7.5  E (GLOVE) ×2
GLOVE SURG SYN 7.5 E (GLOVE) ×1 IMPLANT
GOWN STRL REUS W/ TWL LRG LVL3 (GOWN DISPOSABLE) ×3 IMPLANT
GOWN STRL REUS W/TWL LRG LVL3 (GOWN DISPOSABLE) ×6
IRRIGATION STRYKERFLOW (MISCELLANEOUS) ×1 IMPLANT
IRRIGATOR STRYKERFLOW (MISCELLANEOUS) ×3
IV NS 1000ML (IV SOLUTION) ×2
IV NS 1000ML BAXH (IV SOLUTION) ×1 IMPLANT
KIT TURNOVER KIT A (KITS) ×3 IMPLANT
LABEL OR SOLS (LABEL) ×3 IMPLANT
LIGASURE LAP MARYLAND 5MM 37CM (ELECTROSURGICAL) ×3 IMPLANT
NEEDLE HYPO 22GX1.5 SAFETY (NEEDLE) ×3 IMPLANT
NS IRRIG 500ML POUR BTL (IV SOLUTION) ×3 IMPLANT
PACK LAP CHOLECYSTECTOMY (MISCELLANEOUS) ×3 IMPLANT
PENCIL ELECTRO HAND CTR (MISCELLANEOUS) ×3 IMPLANT
POUCH SPECIMEN RETRIEVAL 10MM (ENDOMECHANICALS) ×3 IMPLANT
RELOAD 45 VASCULAR/THIN (ENDOMECHANICALS) IMPLANT
RELOAD STAPLE TA45 3.5 REG BLU (ENDOMECHANICALS) ×6 IMPLANT
SCISSORS METZENBAUM CVD 33 (INSTRUMENTS) ×3 IMPLANT
SLEEVE ADV FIXATION 5X100MM (TROCAR) ×9 IMPLANT
SUT MNCRL 4-0 (SUTURE) ×4
SUT MNCRL 4-0 27XMFL (SUTURE) ×2
SUT VIC AB 3-0 SH 27 (SUTURE) ×2
SUT VIC AB 3-0 SH 27X BRD (SUTURE) ×1 IMPLANT
SUT VICRYL 0 AB UR-6 (SUTURE) ×3 IMPLANT
SUTURE MNCRL 4-0 27XMF (SUTURE) ×2 IMPLANT
SYR 10ML LL (SYRINGE) ×3 IMPLANT
SYS KII FIOS ACCESS ABD 5X100 (TROCAR) ×6
SYSTEM KII FIOS ACES ABD 5X100 (TROCAR) ×2 IMPLANT
TRAY FOLEY MTR SLVR 16FR STAT (SET/KITS/TRAYS/PACK) ×3 IMPLANT
TROCAR BALLN GELPORT 12X130M (ENDOMECHANICALS) ×6 IMPLANT
TUBING EVAC SMOKE HEATED PNEUM (TUBING) ×3 IMPLANT

## 2019-10-06 NOTE — H&P (Signed)
[facility] SURGICAL HISTORY & PHYSICAL (cpt [phone])  HISTORY OF PRESENT ILLNESS (HPI):  34 y.o. male presented to [facility] ED overnight for abdominal pain. Patient reports early in the morning yesterday he noticed the acute onset of lower abdominal pain. Initially this was fairly mild and located relatively diffusely across his lower abdomen. Throughout the day the pain began to worsen and migrated to specifically his RLQ. He thought it was constipation pains and tried medication for this but this did not relieve his pain. He did endorse nausea, emesis, and decreased appetite with the pain. No fever, chills, cough, congestion, SOB, CP, or bladder/bowel changes. No previous abdominal surgeries. No history of similar in the past. Work up in the ED was concerning for leukocytosis to 20.9K ans CT abdomen/pelvis was concerning for acute appendicitis.   General surgery is consulted by emergency medicine Dr Derrell Lolling, MD for evaluation acute appendicitis.   PAST MEDICAL HISTORY (PMH):  Past Medical History:  Diagnosis Date  . History of chicken pox     Reviewed. Otherwise negative.   PAST SURGICAL HISTORY ([place]):  No past surgical history on file.  Reviewed. Otherwise negative.   MEDICATIONS:  Prior to Admission medications   Medication Sig Start Date End Date Taking? Authorizing Provider  acetaminophen (TYLENOL) 500 MG tablet Take 1,000 mg by mouth every 6 (six) hours as needed.   Yes [provider]  ascorbic acid (VITAMIN C) 500 MG tablet Take 1,000 mg by mouth daily.   Yes [provider]  cholecalciferol (VITAMIN D3) 25 MCG (1000 UNIT) tablet Take 1,000 Units by mouth daily.   Yes [provider]  Zinc Acetate, Oral, (ZINC ACETATE PO) Take 11.25 mg by mouth daily.   Yes [provider]     ALLERGIES:  No Known Allergies   SOCIAL HISTORY:  Social History   Socioeconomic History  . Marital status: Married    Spouse name: Not on file   . Number of children: Not on file  . Years of education: Not on file  . Highest education level: Not on file  Occupational History  . Not on file  Tobacco Use  . Smoking status: Former Research scientist (life sciences)  . Smokeless tobacco: Former Systems developer    Types: Snuff    Quit date: 06/22/2009  . Tobacco comment: ex cigarettes  Substance and Sexual Activity  . Alcohol use: Yes    Alcohol/week: 0.0 standard drinks    Comment: infrequent  . Drug use: No  . Sexual activity: Not on file  Other Topics Concern  . 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 Difficulty of Paying Living Expenses:   Food Insecurity:   . Worried About Charity fundraiser in the Last Year:   . Arboriculturist in the Last Year:   Transportation Needs:   . Film/video editor (Medical):   Marland Kitchen Lack of Transportation (Non-Medical):   Physical Activity:   . Days of Exercise per Week:   . Minutes of Exercise per Session:   Stress:   . Feeling of Stress :   Social Connections:   . Frequency of Communication with Friends and Family:   . Frequency  of Social Gatherings with Friends and Family:   . Attends Religious Services:   . Active Member of Clubs or Organizations:   . Attends Banker Meetings:   Marland Kitchen Marital Status:   Intimate Partner Violence:   . Fear of Current or Ex-Partner:   . Emotionally Abused:   Marland Kitchen Physically Abused:   . Sexually Abused:      FAMILY HISTORY:  Family History  Problem Relation Age of Onset  . Hyperlipidemia Mother   . Hypertension Mother   . Hemochromatosis Mother   . Arthritis Father   . Hyperlipidemia Father   . CAD Father 37       smoker  . Stroke Father   . Hypertension Father   . Peripheral vascular disease Father    . Cancer Father        mouth, smoker  . Heart disease Brother   . Cardiomyopathy Brother 37       LVAD in place (congenital?) half brother  . Cancer Maternal Grandmother 55       uterine  . COPD Paternal Grandmother   . Diabetes Paternal Grandmother   . Bipolar disorder Maternal Uncle     Otherwise negative.   REVIEW OF SYSTEMS:  Review of Systems  Constitutional: Negative for chills and fever.       + Decreased Appetite  Respiratory: Negative for cough and shortness of breath.   Cardiovascular: Negative for chest pain and palpitations.  Gastrointestinal: Positive for abdominal pain, nausea and vomiting. Negative for constipation and diarrhea.  Genitourinary: Negative for dysuria and urgency.  All other systems reviewed and are negative.   VITAL SIGNS:  Temp:  [98 F (36.7 C)-98.2 F (36.8 C)] 98.1 F (36.7 C) (04/16 0538) Pulse Rate:  [55-72] 72 (04/16 0538) Resp:  [16-20] 20 (04/16 0538) BP: (124-142)/(70-90) 135/77 (04/16 0538) SpO2:  [98 %-100 %] 99 % (04/16 0538) Weight:  [97.5 kg-98.6 kg] 98.6 kg (04/16 0303)     Height: 5\' 8"  (172.7 cm) Weight: 98.6 kg BMI (Calculated): 33.06   PHYSICAL EXAM:  Physical Exam Vitals and nursing note reviewed.  Constitutional:      General: He is not in acute distress.    Appearance: He is well-developed. He is obese. He is not ill-appearing.  HENT:     Head: Normocephalic and atraumatic.  Eyes:     General: No scleral icterus.    Extraocular Movements: Extraocular movements intact.  Cardiovascular:     Rate and Rhythm: Normal rate and regular rhythm.     Heart sounds: Normal heart sounds. No murmur. No friction rub.  Pulmonary:     Effort: Pulmonary effort is normal. No respiratory distress.     Breath sounds: Normal breath sounds.  Abdominal:     General: There is no distension.     Palpations: Abdomen is soft.     Tenderness: There is abdominal tenderness in the right lower quadrant. There is no guarding or rebound.  Positive signs include Rovsing's sign and McBurney's sign.     Hernia: No hernia is present.  Genitourinary:    Comments: Deferred Skin:    General: Skin is warm and dry.     Coloration: Skin is not jaundiced or pale.  Neurological:     General: No focal deficit present.     Mental Status: He is alert and oriented to person, place, and time.  Psychiatric:        Mood and Affect: Mood normal.  Behavior: Behavior normal.     INTAKE/OUTPUT:  This shift: No intake/output data recorded.  Last 2 shifts: @IOLAST2SHIFTS @  Labs:  CBC Latest Ref Rng & Units 10/05/2019  WBC 4.0 - 10.5 K/uL 20.9(H)  Hemoglobin 13.0 - 17.0 g/dL 10/07/2019  Hematocrit 99.3 - 52.0 % 46.5  Platelets 150 - 400 K/uL 280   CMP Latest Ref Rng & Units 10/05/2019 05/22/2015  Glucose 70 - 99 mg/dL 05/24/2015) 96  BUN 6 - 20 mg/dL 13 8  Creatinine 177(L - 1.24 mg/dL 3.90 3.00  Sodium 9.23 - 145 mmol/L 136 142  Potassium 3.5 - 5.1 mmol/L 4.3 4.6  Chloride 98 - 111 mmol/L 99 101  CO2 22 - 32 mmol/L 25 24  Calcium 8.9 - 10.3 mg/dL 9.6 9.6  Total Protein 6.5 - 8.1 g/dL 7.6 7.1  Total Bilirubin 0.3 - 1.2 mg/dL 1.1 0.6  Alkaline Phos 38 - 126 U/L 73 85  AST 15 - 41 U/L 23 19  ALT 0 - 44 U/L 19 9     Imaging studies:   CT Abdomen/Pelvis (10/05/2019) personally reviewed showing acute uncomplicated appendicitis, and radiologist report reviewed:  IMPRESSION: 1. Acute uncomplicated appendicitis. No perforation, fluid collection, or abscess.   Assessment/Plan: (ICD-10's: K35.80) 34 y.o. male with RLQ abdominal pain and leukocytosis concerning for acute uncomplicated appendicitis.    - Admit to general surgery  - Will plan on laparoscopic appendectomy this afternoon pending OR/Anesthesia availability, with Dr 34.   All risks, benefits, and alternatives to above procedure(s) were discussed with the patient and his family, all of their questions were answered to their expressed satisfaction, patient expresses he  wishes to proceed, and informed consent was obtained.  - NPO + IVF  - IV Abx (Zosyn)    - Pain control prn; antiemetics prn  - monitor abdominal examination  - mobilization encourgaed   - DVT prophylaxis; hold for OR  All of the above findings and recommendations were discussed with the patient and his family, and all of his and family's questions were answered to their expressed satisfaction.  -- Aleen Campi, PA-C [facility] 10/06/2019, 7:23 AM [phone] M-F: 7am - 4pm

## 2019-10-06 NOTE — Anesthesia Procedure Notes (Signed)
Procedure Name: Intubation Date/Time: 10/06/2019 1:28 PM Performed by: Pearla Dubonnet, CRNA Pre-anesthesia Checklist: Patient identified, Emergency Drugs available, Suction available, Patient being monitored and Timeout performed Patient Re-evaluated:Patient Re-evaluated prior to induction Oxygen Delivery Method: Circle system utilized Preoxygenation: Pre-oxygenation with 100% oxygen Induction Type: IV induction Ventilation: Two handed mask ventilation required and Oral airway inserted - appropriate to patient size Laryngoscope Size: McGraph and 4 Grade View: Grade I Tube type: Oral Tube size: 7.5 mm Number of attempts: 1 Airway Equipment and Method: Stylet Dental Injury: Teeth and Oropharynx as per pre-operative assessment

## 2019-10-06 NOTE — Op Note (Signed)
  Procedure Date:  10/06/2019  Pre-operative Diagnosis:  Acute appendicitis  Post-operative Diagnosis:  Acute gangrenous appendicitis  Procedure:  Laparoscopic appendectomy  Surgeon:  Howie Ill, MD  Assistant:  Karn Pickler, PA-S  Anesthesia:  General endotracheal  Estimated Blood Loss:  5 ml  Specimens:  appendix  Complications:  None  Indications for Procedure:  This is a 34 y.o. male who presents with abdominal pain and workup revealing acute appendicitis.  The options of surgery versus observation were reviewed with the patient and/or family. The risks of bleeding, infection, recurrence of symptoms, negative laparoscopy, potential for an open procedure, bowel injury, abscess or infection, were all discussed with the patient and he was willing to proceed.  Description of Procedure: The patient was correctly identified in the preoperative area and brought into the operating room.  The patient was placed supine with VTE prophylaxis in place.  Appropriate time-outs were performed.  Anesthesia was induced and the patient was intubated.  Foley catheter was placed.  Appropriate antibiotics were infused.  The abdomen was prepped and draped in a sterile fashion. An infraumbilical incision was made. A cutdown technique was used to enter the abdominal cavity without injury, and a Hasson trocar was inserted.  Pneumoperitoneum was obtained with appropriate opening pressures.  Two 5-mm ports were placed in the suprapubic and left lateral positions under direct visualization.  The right lower quadrant was inspected and the appendix was identified and found to be acutely inflamed and ischemic to gangrenous.  The base of the appendix was healthy.  There was ascitic fluid in the pelvis.  The appendix was carefully dissected.  The mesoappendix was divided using the LigaSure.  The base of the appendix was dissected out and divided with a standard load Endo GIA.  The appendix was placed in an  Endocatch bag.  The right lower quadrant was then inspected again revealing an intact staple line, no bleeding, and no bowel injury.  The area was thoroughly irrigated.  The 5 mm ports were removed under direct visualization and the Hasson trocar was removed.  The Endocatch bag was brought out through the umbilical incision.  The fascial opening was closed using 0 vicryl suture.  Local anesthetic was infused in all incisions.  The umbilical incision was closed in two layers with 3-0 Vicryl and 4-0 Monocryl, and the other port incisions were closed with 4-0 Monocryl.  The wounds were cleaned and sealed with DermaBond.  Foley catheter was removed and the patient was emerged from anesthesia and extubated and brought to the recovery room for further management.  The patient tolerated the procedure well and all counts were correct at the end of the case.   Howie Ill, MD

## 2019-10-06 NOTE — OR Nursing (Signed)
Pt. Taken to surgery. I told them they were not supposed to go until the surgeon seen him. They advised it was fine to go because Dr. Tomasa Blase had seen him and they were in the same practice. They advised they "Jeanella Flattery said we can take him". Pt. Advised he was fine with not meeting his surgeon prior to surgery. They then left with patient.

## 2019-10-06 NOTE — Transfer of Care (Signed)
Immediate Anesthesia Transfer of Care Note  Patient: Keith Rasmussen  Procedure(s) Performed: APPENDECTOMY LAPAROSCOPIC (N/A Abdomen)  Patient Location: PACU  Anesthesia Type:General  Level of Consciousness: sedated  Airway & Oxygen Therapy: Patient Spontanous Breathing and Patient connected to face mask oxygen  Post-op Assessment: Report given to RN and Post -op Vital signs reviewed and stable  Post vital signs: Reviewed and stable  Last Vitals:  Vitals Value Taken Time  BP 111/59 10/06/19 1517  Temp 37.2 C 10/06/19 1517  Pulse 83 10/06/19 1520  Resp 20 10/06/19 1520  SpO2 99 % 10/06/19 1520  Vitals shown include unvalidated device data.  Last Pain:  Vitals:   10/06/19 1517  TempSrc:   PainSc: (P) Asleep         Complications: No apparent anesthesia complications

## 2019-10-06 NOTE — Anesthesia Preprocedure Evaluation (Addendum)
Anesthesia Evaluation  Patient identified by MRN, date of birth, ID band Patient awake    Reviewed: Allergy & Precautions, H&P , NPO status , Patient's Chart, lab work & pertinent test results  Airway Mallampati: II  TM Distance: >3 FB Neck ROM: Full    Dental no notable dental hx.    Pulmonary neg sleep apnea, neg COPD, former smoker,    breath sounds clear to auscultation- rhonchi (-) wheezing      Cardiovascular Exercise Tolerance: Good (-) hypertension(-) CAD, (-) Past MI, (-) Cardiac Stents and (-) CABG negative cardio ROS   Rhythm:Regular Rate:Normal - Systolic murmurs and - Diastolic murmurs    Neuro/Psych neg Seizures negative neurological ROS  negative psych ROS   GI/Hepatic negative GI ROS, Neg liver ROS,   Endo/Other  negative endocrine ROSneg diabetes  Renal/GU      Musculoskeletal negative musculoskeletal ROS (+)   Abdominal (+) + obese,   Peds  Hematology negative hematology ROS (+)   Anesthesia Other Findings Obese  Past Medical History: No date: History of chicken pox  No past surgical history on file.  BMI    Body Mass Index: 33.05 kg/m      Reproductive/Obstetrics negative OB ROS                            Anesthesia Physical Anesthesia Plan  ASA: II  Anesthesia Plan: General ETT   Post-op Pain Management:    Induction: Intravenous  PONV Risk Score and Plan: 1 and Ondansetron, Dexamethasone, Midazolam and Treatment may vary due to age or medical condition  Airway Management Planned: Oral ETT  Additional Equipment:   Intra-op Plan:   Post-operative Plan: Extubation in OR  Informed Consent: I have reviewed the patients History and Physical, chart, labs and discussed the procedure including the risks, benefits and alternatives for the proposed anesthesia with the patient or authorized representative who has indicated his/her understanding and  acceptance.     Dental Advisory Given  Plan Discussed with: Anesthesiologist  Anesthesia Plan Comments:       Anesthesia Quick Evaluation

## 2019-10-07 MED ORDER — IBUPROFEN 600 MG PO TABS: 600.0000 mg | ORAL_TABLET | Freq: Three times a day (TID) | ORAL | 1 refills | Status: DC | PRN

## 2019-10-07 MED ORDER — AMOXICILLIN-POT CLAVULANATE 875-125 MG PO TABS: 1.0000 | ORAL_TABLET | Freq: Two times a day (BID) | ORAL | 0 refills | Status: AC

## 2019-10-07 MED ORDER — OXYCODONE HCL 5 MG PO TABS: 5.0000 mg | ORAL_TABLET | ORAL | 0 refills | Status: DC | PRN

## 2019-10-07 NOTE — Plan of Care (Signed)
Patient discharged to home.  Medication and discharge instructions given.  Patient verbalized understanding and states will pick up meds from pharmacy.  States pain adequately controlled and ambulated in hallway prior to discharge without difficulty.  PIV removed with tip intact.Patient wife transported to home.

## 2019-10-07 NOTE — Discharge Summary (Signed)
Patient ID: OVERTON BOGGUS MRN: [id number] DOB/AGE: 33-31-1987 33 y.o.  Admit date: 10/05/2019 Discharge date: 10/07/2019   Discharge Diagnoses:  Active Problems:   Appendicitis   Procedures:  Laparoscopic appendectomy  Hospital Course:   Patient was admitted on 4/15 with acute appendicitis without perforation.  He was taken to the OR on 4/16 for a laparoscopic appendectomy, which was uncomplicated.  Post-operatively, he progressed well and his diet was advanced.  His pain was well controlled.  He was voiding without issues.    On exam, he was in no acute distress with stable vital signs.  His abdomen was soft, non-distended, appropriately tender.  Incisions were clean, dry, intact.    He will follow up in 2 weeks, will have Augmentin 10 day course.  Consults: None  Disposition: Discharge disposition: 01-Home or Self Care       Discharge Instructions    Call MD for:  difficulty breathing, headache or visual disturbances   Complete by: As directed    Call MD for:  persistant nausea and vomiting   Complete by: As directed    Call MD for:  redness, tenderness, or signs of infection (pain, swelling, redness, odor or green/yellow discharge around incision site)   Complete by: As directed    Call MD for:  severe uncontrolled pain   Complete by: As directed    Call MD for:  temperature >100.4   Complete by: As directed    Diet - low sodium heart healthy   Complete by: As directed    Discharge instructions   Complete by: As directed    1.  Patient may shower, but do not scrub wounds heavily and dab dry only. 2.  Do not submerge wounds in pool/tub for 1 week. 3.  Do not apply ointments or hydrogen peroxide to the wounds.   Driving Restrictions   Complete by: As directed    Do not drive while taking narcotics for pain control.   Increase activity slowly   Complete by: As directed    Lifting restrictions   Complete by: As directed    No heavy lifting or pushing of more than  10-15 lbs for 4 weeks.   No dressing needed   Complete by: As directed      Allergies as of 10/07/2019   No Known Allergies     Medication List    TAKE these medications   acetaminophen 500 MG tablet Commonly known as: TYLENOL Take 1,000 mg by mouth every 6 (six) hours as needed.   amoxicillin-clavulanate 875-125 MG tablet Commonly known as: Augmentin Take 1 tablet by mouth 2 (two) times daily for 10 days.   ascorbic acid 500 MG tablet Commonly known as: VITAMIN C Take 1,000 mg by mouth daily.   cholecalciferol 25 MCG (1000 UNIT) tablet Commonly known as: VITAMIN D3 Take 1,000 Units by mouth daily.   ibuprofen 600 MG tablet Commonly known as: ADVIL Take 1 tablet (600 mg total) by mouth every 8 (eight) hours as needed for mild pain or moderate pain.   oxyCODONE 5 MG immediate release tablet Commonly known as: Oxy IR/ROXICODONE Take 1 tablet (5 mg total) by mouth every 4 (four) hours as needed for severe pain.   ZINC ACETATE PO Take 11.25 mg by mouth daily.      Follow-up Information    Donovan Kail, PA-C Follow up in 2 week(s).   Specialty: Physician Assistant Contact information: [address] [place] [address] [phone]

## 2019-10-07 NOTE — Discharge Instructions (Signed)
Abdominal Pain, Adult Many things can cause belly (abdominal) pain. Most times, belly pain is not dangerous. Many cases of belly pain can be watched and treated at home. Sometimes, though, belly pain is serious. Your doctor will try to find the cause of your belly pain. Follow these instructions at home:  Medicines  Take over-the-counter and prescription medicines only as told by your doctor.  Do not take medicines that help you poop (laxatives) unless told by your doctor. General instructions  Watch your belly pain for any changes.  Drink enough fluid to keep your pee (urine) pale yellow.  Keep all follow-up visits as told by your doctor. This is important. Contact a doctor if:  Your belly pain changes or gets worse.  You are not hungry, or you lose weight without trying.  You are having trouble pooping (constipated) or have watery poop (diarrhea) for more than 2-3 days.  You have pain when you pee or poop.  Your belly pain wakes you up at night.  Your pain gets worse with meals, after eating, or with certain foods.  You are vomiting and cannot keep anything down.  You have a fever.  You have blood in your pee. Get help right away if:  Your pain does not go away as soon as your doctor says it should.  You cannot stop vomiting.  Your pain is only in areas of your belly, such as the right side or the left lower part of the belly.  You have bloody or black poop, or poop that looks like tar.  You have very bad pain, cramping, or bloating in your belly.  You have signs of not having enough fluid or water in your body (dehydration), such as: ? Dark pee, very little pee, or no pee. ? Cracked lips. ? Dry mouth. ? Sunken eyes. ? Sleepiness. ? Weakness.  You have trouble breathing or chest pain. Summary  Many cases of belly pain can be watched and treated at home.  Watch your belly pain for any changes.  Take over-the-counter and prescription medicines only as  told by your doctor.  Contact a doctor if your belly pain changes or gets worse.  Get help right away if you have very bad pain, cramping, or bloating in your belly. This information is not intended to replace advice given to you by your health care provider. Make sure you discuss any questions you have with your health care provider. Document Revised: 10/17/2018 Document Reviewed: 10/17/2018 Elsevier Patient Education  2020 [employer].   Appendicitis, Adult  The appendix is a tube in the body that is shaped like a finger. It is attached to the large intestine. Appendicitis means that this tube is swollen (inflamed). If this is not treated, the tube can tear (rupture). This can lead to a life-threatening infection. This condition can also cause pus to build up in the appendix (abscess). What are the causes? This condition may be caused by something that blocks the appendix. These include:  A ball of poop (stool).  Lymph glands that are bigger than normal. Sometimes the cause is not known. What increases the risk? You are more likely to develop this condition if you are between 41 and 70 years of age. What are the signs or symptoms? Symptoms of this condition include:  Pain around the belly button (navel). ? The pain moves toward the lower right belly (abdomen). ? The pain can get worse with time. ? The pain can get worse if you  cough. ? The pain can get worse if you move suddenly.  Tenderness in the lower right belly.  Feeling sick to your stomach (nauseous).  Throwing up (vomiting).  Not feeling hungry (loss of appetite).  A fever.  Having trouble pooping (constipation).  Watery poop (diarrhea).  Not feeling well. How is this treated? Most often, this condition is treated by taking out the appendix (appendectomy). There are two ways to do this:  Open surgery. For this method, the appendix is taken out through a large cut (incision). The cut is made in the lower  right belly. This surgery may be used if: ? You have scars from another surgery. ? You have a bleeding condition. ? You are pregnant and will be having your baby soon. ? You have a condition that makes it hard to do the other type of surgery.  Laparoscopic surgery. For this method, the appendix is taken out through small cuts. Often, this surgery: ? Causes less pain. ? Causes fewer problems. ? Is easier to heal from. If your appendix tears and pus forms:  A drain may be put into the sore. The drain will be used to get rid of the pus.  You may get an antibiotic medicine through an IV line.  Your appendix may or may not need to be taken out. Follow these instructions at home: If you had surgery, follow instructions from your doctor on how to care for yourself at home and how to take care of your cut from surgery. Medicines  Take over-the-counter and prescription medicines only as told by your doctor.  If you were prescribed an antibiotic medicine, take it as told by your doctor. Do not stop taking the antibiotic even if you start to feel better. Eating and drinking Follow instructions from your doctor about what you cannot eat or drink. You may go back to your diet slowly if:  You no longer feel sick to your stomach.  You have stopped throwing up. General instructions  Do not use any products that contain nicotine or tobacco, such as cigarettes, e-cigarettes, and chewing tobacco. If you need help quitting, ask your doctor.  Do not drive or use heavy machinery while taking prescription pain medicine.  Ask your doctor if the medicine you are taking can cause trouble pooping. You may need to take steps to prevent or treat trouble pooping: ? Drink enough fluid to keep your pee (urine) pale yellow. ? Take over-the-counter or prescription medicines. ? Eat foods that are high in fiber. These include beans, whole grains, and fresh fruits and vegetables. ? Limit foods that are high in  fat and sugar. These include fried or sweet foods.  Keep all follow-up visits as told by your doctor. This is important. Contact a doctor if:  There is pus, blood, or a lot of fluid coming from your cut or cuts from surgery.  You are sick to your stomach or you throw up. Get help right away if:  You have pain in your belly, and the pain is getting worse.  You have a fever.  You have chills.  You are very tired.  You have muscle pain.  You are short of breath. Summary  Appendicitis is swelling of the appendix. The appendix is a tube that is shaped like a finger. It is joined to the large intestine.  This condition may be caused by something that blocks the appendix. This can lead to an infection.  This condition is usually treated by taking  out the appendix. This information is not intended to replace advice given to you by your health care provider. Make sure you discuss any questions you have with your health care provider. Document Revised: 11/24/2017 Document Reviewed: 11/24/2017 Elsevier Patient Education  [employer].

## 2019-10-09 ENCOUNTER — Telehealth: Payer: Self-pay

## 2019-10-09 NOTE — Telephone Encounter (Signed)
-----   Message from Henrene Dodge, MD sent at 10/07/2019  4:11 PM EDT ----- Regarding: follow up with Keith Rasmussen,  This patient came in with acute appendicitis and had a laparoscopic appendectomy with me yesterday.  He was discharged today Saturday and he will need a follow up appointment with Ian Malkin in about two weeks.  Thanks!  [employer]

## 2019-10-09 NOTE — Anesthesia Postprocedure Evaluation (Signed)
Anesthesia Post Note  Patient: Keith Rasmussen  Procedure(s) Performed: APPENDECTOMY LAPAROSCOPIC (N/A Abdomen)  Patient location during evaluation: PACU Anesthesia Type: General Level of consciousness: awake and alert and oriented Pain management: pain level controlled Vital Signs Assessment: post-procedure vital signs reviewed and stable Respiratory status: spontaneous breathing, nonlabored ventilation and respiratory function stable Cardiovascular status: blood pressure returned to baseline and stable Postop Assessment: no signs of nausea or vomiting Anesthetic complications: no     Last Vitals:  Vitals:   10/06/19 2052 10/07/19 0425  BP: 116/80 108/70  Pulse: 78 71  Resp: 16 18  Temp: 37 C 36.7 C  SpO2: 98% 97%    Last Pain:  Vitals:   10/07/19 1100  TempSrc:   PainSc: 1                  Asad Keeven

## 2019-10-09 NOTE — Telephone Encounter (Signed)
Left detailed message for patient instructed him of Dr.Piscoya's recommendations. Instructed patient to give our office a call at his earliest convenience.

## 2019-10-10 LAB — SURGICAL PATHOLOGY

## 2019-10-24 ENCOUNTER — Encounter: Payer: Self-pay | Admitting: Physician Assistant

## 2019-10-24 ENCOUNTER — Ambulatory Visit (INDEPENDENT_AMBULATORY_CARE_PROVIDER_SITE_OTHER): Payer: Self-pay | Admitting: Physician Assistant

## 2019-10-24 ENCOUNTER — Other Ambulatory Visit: Payer: Self-pay

## 2019-10-24 ENCOUNTER — Encounter: Payer: Managed Care, Other (non HMO) | Admitting: Physician Assistant

## 2019-10-24 VITALS — BP 137/87 | HR 71 | Temp 97.9°F | Resp 12 | Ht 69.0 in | Wt 210.6 lb

## 2019-10-24 DIAGNOSIS — K3531 Acute appendicitis with localized peritonitis and gangrene, without perforation: Secondary | ICD-10-CM

## 2019-10-24 DIAGNOSIS — Z09 Encounter for follow-up examination after completed treatment for conditions other than malignant neoplasm: Secondary | ICD-10-CM

## 2019-10-24 NOTE — Patient Instructions (Addendum)
Follow up as needed, call the office if you have any questions or concerns.  GENERAL POST-OPERATIVE PATIENT INSTRUCTIONS   WOUND CARE INSTRUCTIONS:  Keep a dry clean dressing on the wound if there is drainage. The initial bandage may be removed after 24 hours.  Once the wound has quit draining you may leave it open to air.  If clothing rubs against the wound or causes irritation and the wound is not draining you may cover it with a dry dressing during the daytime.  Try to keep the wound dry and avoid ointments on the wound unless directed to do so.  If the wound becomes bright red and painful or starts to drain infected material that is not clear, please contact your physician immediately.  If the wound is mildly pink and has a thick firm ridge underneath it, this is normal, and is referred to as a healing ridge.  This will resolve over the next 4-6 weeks.  BATHING: You may shower if you have been informed of this by your surgeon. However, Please do not submerge in a tub, hot tub, or pool until incisions are completely sealed or have been told by your surgeon that you may do so.  DIET:  You may eat any foods that you can tolerate.  It is a good idea to eat a high fiber diet and take in plenty of fluids to prevent constipation.  If you do become constipated you may want to take a mild laxative or take ducolax tablets on a daily basis until your bowel habits are regular.  Constipation can be very uncomfortable, along with straining, after recent surgery.  ACTIVITY:  You are encouraged to cough and deep breath or use your incentive spirometer if you were given one, every 15-30 minutes when awake.  This will help prevent respiratory complications and low grade fevers post-operatively if you had a general anesthetic.  You may want to hug a pillow when coughing and sneezing to add additional support to the surgical area, if you had abdominal or chest surgery, which will decrease pain during these times.  You  are encouraged to walk and engage in light activity for the next two weeks.  You should not lift more than 15 pounds, until 11/03/19 as it could put you at increased risk for complications.  Twenty pounds is roughly equivalent to a plastic bag of groceries. At that time- Listen to your body when lifting, if you have pain when lifting, stop and then try again in a few days. Soreness after doing exercises or activities of daily living is normal as you get back in to your normal routine.  MEDICATIONS:  Try to take narcotic medications and anti-inflammatory medications, such as tylenol, ibuprofen, naprosyn, etc., with food.  This will minimize stomach upset from the medication.  Should you develop nausea and vomiting from the pain medication, or develop a rash, please discontinue the medication and contact your physician.  You should not drive, make important decisions, or operate machinery when taking narcotic pain medication.  SUNBLOCK Use sun block to incision area over the next year if this area will be exposed to sun. This helps decrease scarring and will allow you avoid a permanent darkened area over your incision.  QUESTIONS:  Please feel free to call our office if you have any questions, and we will be glad to assist you.

## 2019-10-24 NOTE — Progress Notes (Signed)
[facility] POST-OP OFFICE VISIT  10/24/2019  HPI: Keith Rasmussen is a 34 y.o. male 18 days s/p laparoscopic appendectomy for acute gangrenous appendicitis with Dr Aleen Campi.  Overall doing well He has completed Abx No abdominal pain, nausea, emesis, fever, chills Tolerating PO Normal bowel function No other complaints.   Vital signs: BP 137/87   Pulse 71   Temp 97.9 F (36.6 C)   Resp 12   Ht 5\' 9"  (1.753 m)   Wt 210 lb 9.6 oz (95.5 kg)   SpO2 97%   BMI 31.10 kg/m    Physical Exam: Constitutional: Well appearing male, NAD Abdomen: Soft, non-tender, non-distended, no rebound/guarding Skin: Laparoscopic incisions are well healed, no erythema ro drainage    Assessment/Plan: This is a 35 y.o. male 18 days s/p laparoscopic appendectomy for acute gangrenous appendicitis    - Pain control prn  - reviewed lifting restrictions and post-op course  - reviewed pathology: Acute transmural appendicitis with serositis  - rtc prn  -- 35, PA-C [facility] 10/24/2019, 2:24 PM [phone] M-F: 7am - 4pm

## 2021-01-02 ENCOUNTER — Encounter: Payer: Self-pay | Admitting: Family Medicine

## 2021-05-13 IMAGING — CT CT ABD-PELV W/ CM
2 of 4 series · 16 of 46 positions shown, 18 images · IV contrast (APPLIED)
Comparison: None.

CLINICAL DATA: Bilateral lower abdominal pain, right abdominal
tenderness radiating to right lower back

EXAM:
CT ABDOMEN AND PELVIS WITH CONTRAST
TECHNIQUE: Multidetector CT imaging of the abdomen and pelvis was performed
using the standard protocol following bolus administration of
intravenous contrast.
CONTRAST:  100mL OMNIPAQUE IOHEXOL 300 MG/ML  SOLN

[Series 2: routine abd/pel with · axial · 0.90mm/px · z∈[-1055,-590]mm · 13 of 103 slices shown, 15 images]
[im 5/103  soft-tissue]
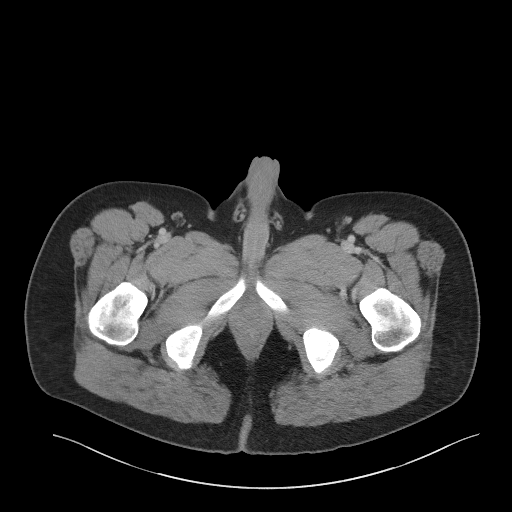
[im 5/103  bone]
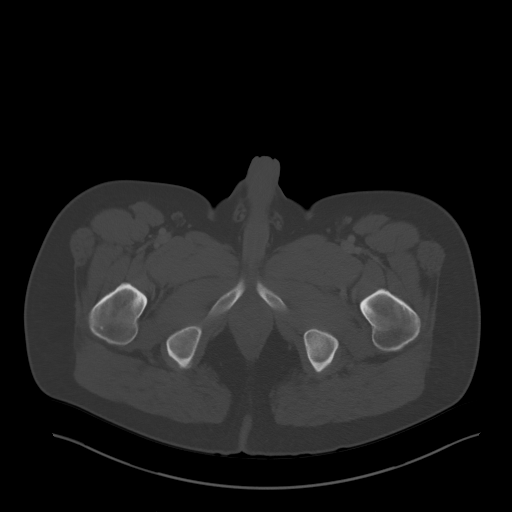
[im 13/103  soft-tissue]
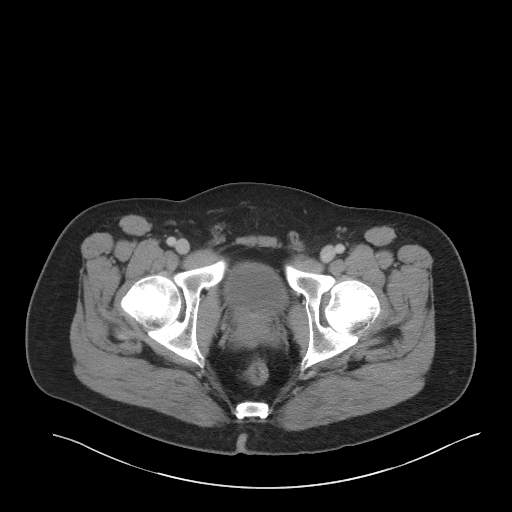
[im 22/103  soft-tissue]
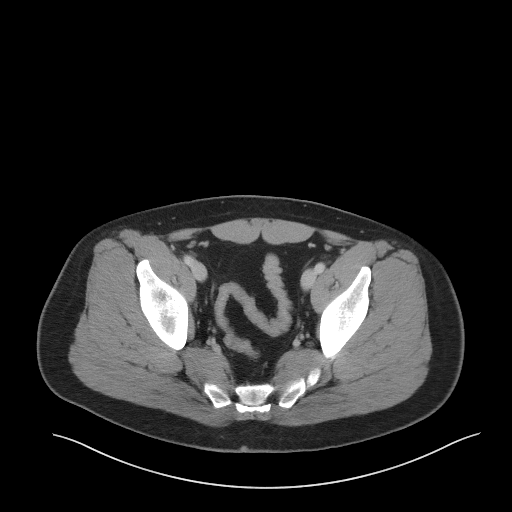
[im 30/103  soft-tissue]
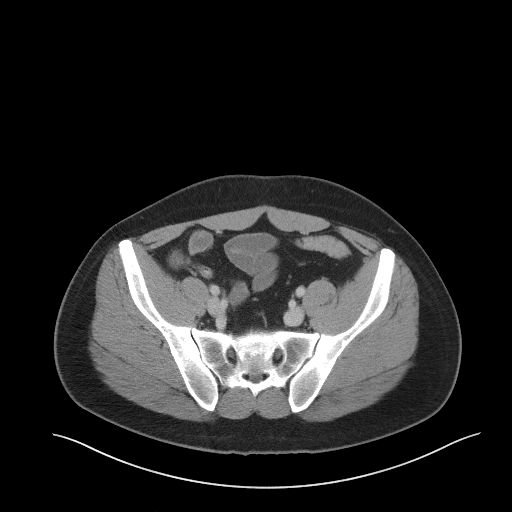
[im 35/103  soft-tissue]
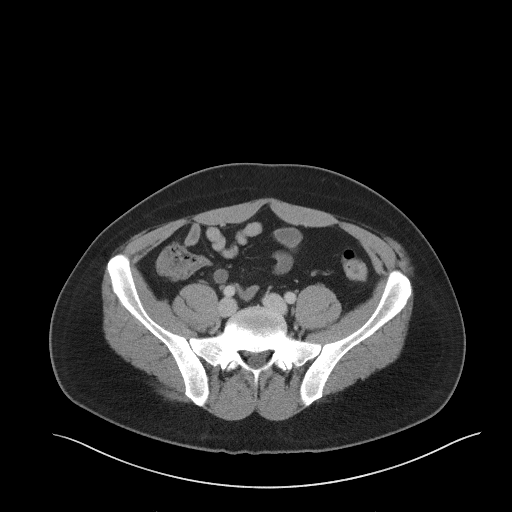
[im 43/103  soft-tissue]
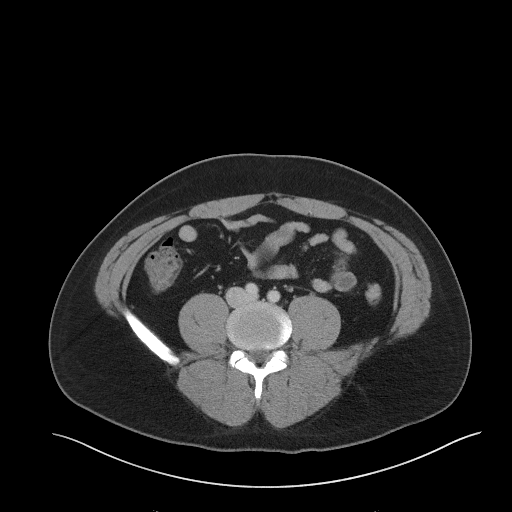
[im 52/103  soft-tissue]
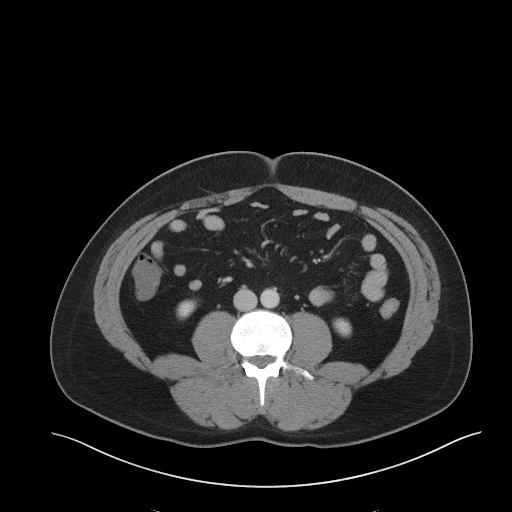
[im 60/103  soft-tissue]
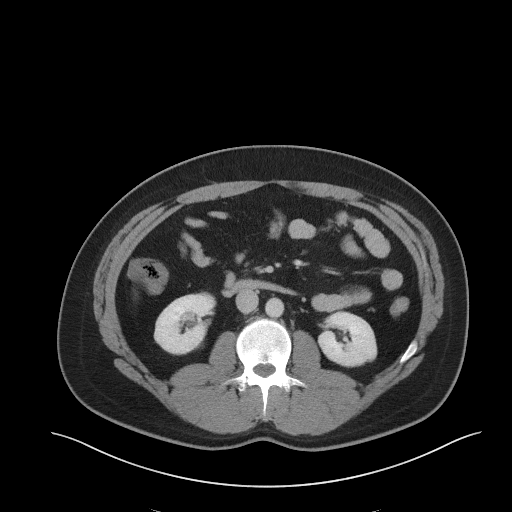
[im 69/103  soft-tissue]
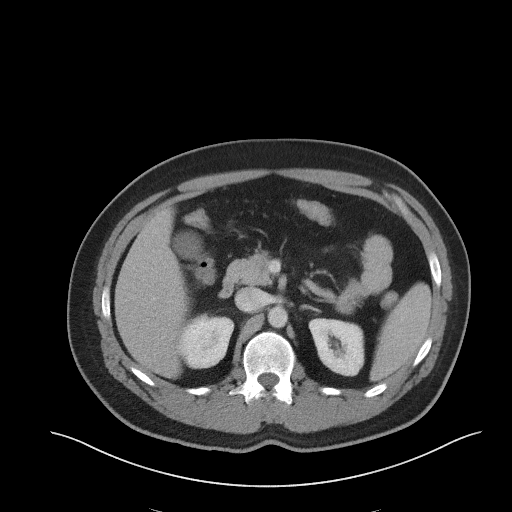
[im 69/103  bone]
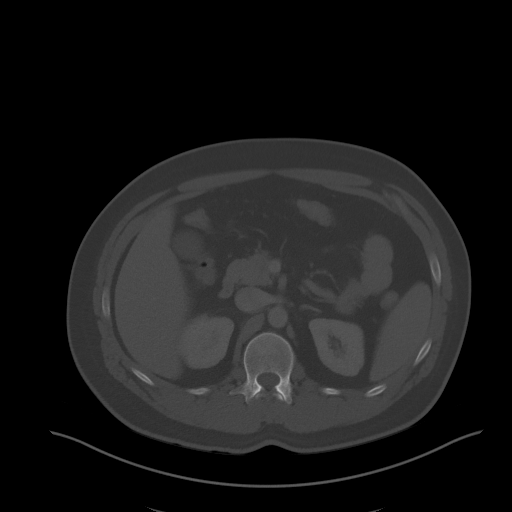
[im 73/103  soft-tissue]
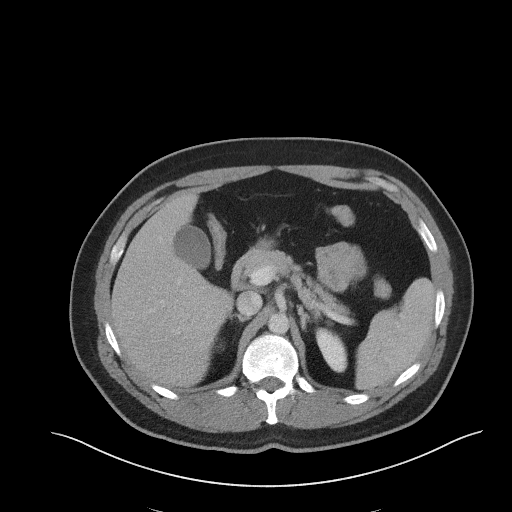
[im 81/103  soft-tissue]
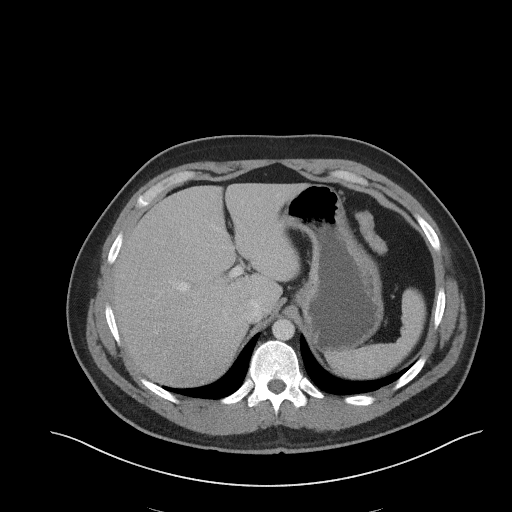
[im 90/103  soft-tissue]
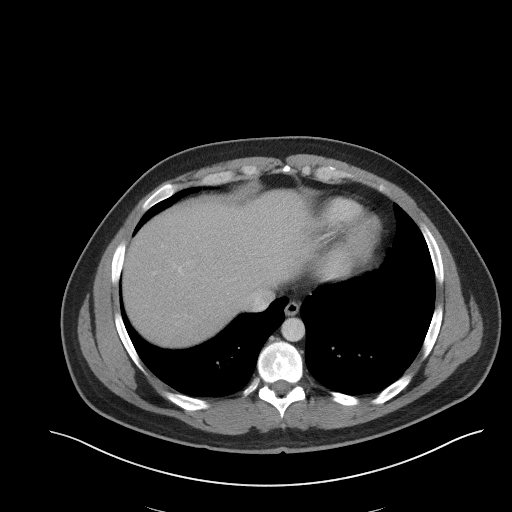
[im 98/103  soft-tissue]
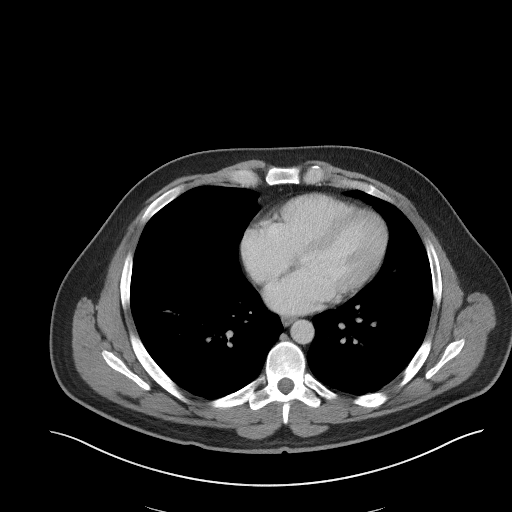

[Series 5: coronal st · coronal · 0.76mm/px · 3 of 92 slices shown]
[im 31/92  soft-tissue]
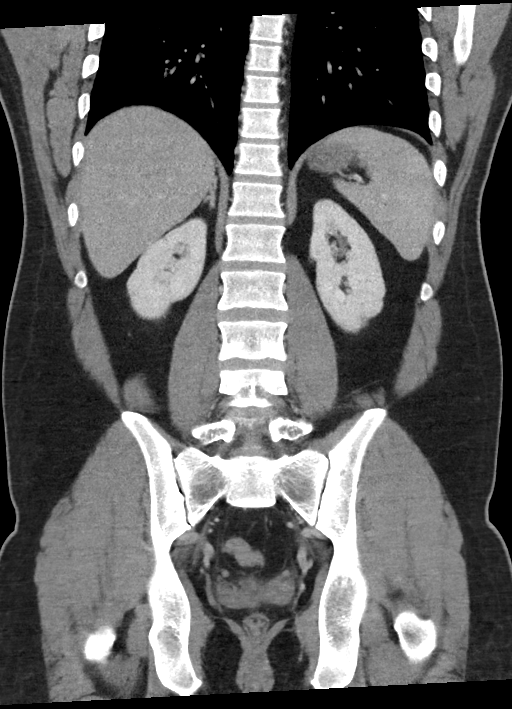
[im 41/92  soft-tissue]
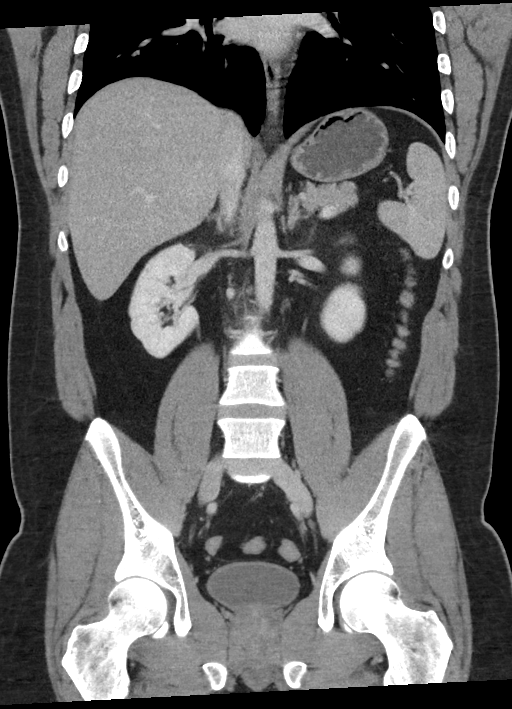
[im 51/92  soft-tissue]
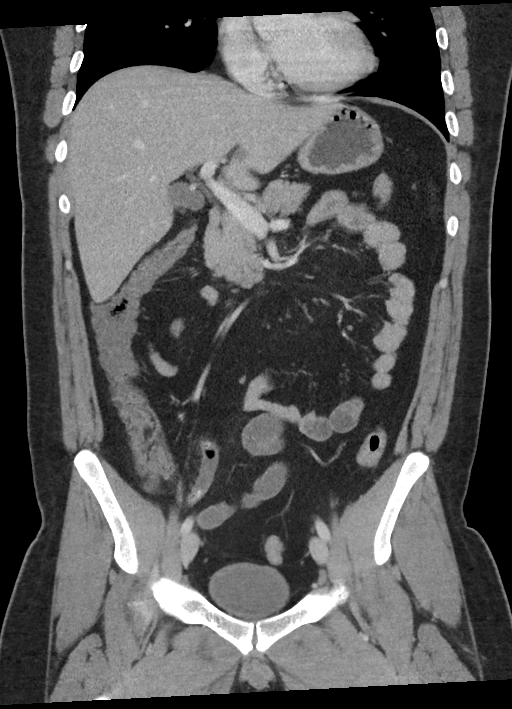

[16 of 46 positions shown; findings below may reference images not displayed]

FINDINGS: Lower chest: No acute pleural or parenchymal lung disease.

Hepatobiliary: No focal liver abnormality is seen. No gallstones,
gallbladder wall thickening, or biliary dilatation.

Pancreas: Unremarkable. No pancreatic ductal dilatation or
surrounding inflammatory changes.

Spleen: Normal in size without focal abnormality.

Adrenals/Urinary Tract: The adrenals are normal. Kidneys enhance
normally and symmetrically. No urinary tract calculi or obstruction.
The bladder is unremarkable.

Stomach/Bowel: No bowel obstruction or ileus. Dilated appendix is
seen within the right lower quadrant measuring up to 13 mm. There is
an appendicolith at the appendiceal orifice. No significant wall
thickening. Minimal periappendiceal fat stranding.

Vascular/Lymphatic: No significant vascular findings are present. No
enlarged abdominal or pelvic lymph nodes.

Reproductive: Prostate is unremarkable.

Other: No abdominal wall hernia or abnormality. No abdominopelvic
ascites.

Musculoskeletal: No acute or destructive bony lesions. Reconstructed
images demonstrate no additional findings.
IMPRESSION: 1. Acute uncomplicated appendicitis. No perforation, fluid
collection, or abscess.
